# Patient Record
Sex: Female | Born: 1995 | Race: White | Hispanic: No | Marital: Single | Smoking: Current some day smoker
Health system: Southern US, Community
[De-identification: ages and names within clinical notes are randomized; demographics above are authoritative.]

## PROBLEM LIST (undated history)

## (undated) ENCOUNTER — Inpatient Hospital Stay (HOSPITAL_COMMUNITY): Payer: Self-pay

## (undated) DIAGNOSIS — R011 Cardiac murmur, unspecified: Secondary | ICD-10-CM

## (undated) DIAGNOSIS — Z3490 Encounter for supervision of normal pregnancy, unspecified, unspecified trimester: Secondary | ICD-10-CM

## (undated) HISTORY — PX: ADENOIDECTOMY: SUR15

## (undated) HISTORY — DX: Encounter for supervision of normal pregnancy, unspecified, unspecified trimester: Z34.90

## (undated) HISTORY — PX: TONSILLECTOMY: SUR1361

## (undated) HISTORY — PX: OTHER SURGICAL HISTORY: SHX169

---
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
## 2019-02-01 ENCOUNTER — Ambulatory Visit (INDEPENDENT_AMBULATORY_CARE_PROVIDER_SITE_OTHER): Payer: Medicaid Other | Admitting: Family Medicine

## 2019-02-01 ENCOUNTER — Encounter: Payer: Self-pay | Admitting: Family Medicine

## 2019-02-01 ENCOUNTER — Other Ambulatory Visit: Payer: Self-pay

## 2019-02-01 VITALS — BP 114/60 | HR 60 | Temp 98.2°F | Resp 16 | Ht 63.0 in | Wt 122.0 lb

## 2019-02-01 DIAGNOSIS — Z113 Encounter for screening for infections with a predominantly sexual mode of transmission: Secondary | ICD-10-CM

## 2019-02-01 DIAGNOSIS — Z30431 Encounter for routine checking of intrauterine contraceptive device: Secondary | ICD-10-CM

## 2019-02-01 DIAGNOSIS — N926 Irregular menstruation, unspecified: Secondary | ICD-10-CM

## 2019-02-01 DIAGNOSIS — Z3202 Encounter for pregnancy test, result negative: Secondary | ICD-10-CM | POA: Diagnosis not present

## 2019-02-01 LAB — POCT URINE PREGNANCY: Preg Test, Ur: NEGATIVE

## 2019-02-01 NOTE — Progress Notes (Signed)
[facility]  New Patient Encounter Provider: Mike Gipndre Carold Eisner, FNP    [id number]  [id number]:[id number]  DOB - [birth year]  SUBJECTIVE:   Krista BoerKatie Stegmann, is a 23 y.o. female who presents to establish care with this clinic.   Current problems/concerns:  She reports having Mirena placed in 2017. Since then, she has been having irregular periods. Patient states that her periods have been lasting much longer over the past year. She states that she had bled for a full month in the past. Patient reports that she is currently sexually active with one female partner without barrier methods. Denies abnormal vaginal discharge or stomach pain at the present time. Patient states that she has not had the IUD checked since receiving it.  No Known Allergies Past Medical History:  Diagnosis Date  . Heart murmur   . Pregnant 05/01/2015   Current Outpatient Medications on File Prior to Visit  Medication Sig Dispense Refill  . levonorgestrel (MIRENA) 20 MCG/24HR IUD 1 each by Intrauterine route once.     No current facility-administered medications on file prior to visit.    Family History  Problem Relation Age of Onset  . Hyperlipidemia Mother   . Allergies Brother   . Hypertension Maternal Grandfather   . Hyperlipidemia Maternal Grandfather    Social History   Socioeconomic History  . Marital status: Single    Spouse name: Not on file  . Number of children: Not on file  . Years of education: Not on file  . Highest education level: Not on file  Occupational History  . Not on file  Social Needs  . Financial resource strain: Not on file  . Food insecurity    Worry: Not on file    Inability: Not on file  . Transportation needs    Medical: Not on file    Non-medical: Not on file  Tobacco Use  . Smoking status: Former Smoker    Types: Cigarettes  . Smokeless tobacco: Former NeurosurgeonUser    Types: Chew  Substance and Sexual Activity  . Alcohol use: Yes     Comment: occ  . Drug use: No  . Sexual activity: Not Currently    Birth control/protection: None  Lifestyle  . Physical activity    Days per week: Not on file    Minutes per session: Not on file  . Stress: Not on file  Relationships  . Social Musicianconnections    Talks on phone: Not on file    Gets together: Not on file    Attends religious service: Not on file    Active member of club or organization: Not on file    Attends meetings of clubs or organizations: Not on file    Relationship status: Not on file  . Intimate partner violence    Fear of current or ex partner: Not on file    Emotionally abused: Not on file    Physically abused: Not on file    Forced sexual activity: Not on file  Other Topics Concern  . Not on file  Social History Narrative  . Not on file    Review of Systems  Constitutional: Negative.   HENT: Negative.   Eyes: Negative.   Respiratory: Negative.   Cardiovascular: Negative.   Gastrointestinal: Negative.   Genitourinary: Negative.   Musculoskeletal: Negative.   Skin: Negative.   Neurological: Negative.   Psychiatric/Behavioral: Negative.     OBJECTIVE:    BP 114/60 (BP Location: Left Arm,  Patient Position: Sitting, Cuff Size: Normal)   Pulse 60   Temp 98.2 F (36.8 C) (Oral)   Resp 16   Ht 5\' 3"  (1.6 m)   Wt 122 lb (55.3 kg)   LMP 01/01/2019   SpO2 100%   BMI 21.61 kg/m   Physical Exam  Constitutional: She is oriented to person, place, and time and well-developed, well-nourished, and in no distress. No distress.  HENT:  Head: Normocephalic and atraumatic.  Eyes: Pupils are equal, round, and reactive to light. Conjunctivae and EOM are normal.  Neck: Normal range of motion. Neck supple.  Cardiovascular: Normal rate, regular rhythm and intact distal pulses. Exam reveals no gallop and no friction rub.  No murmur heard. Pulmonary/Chest: Effort normal and breath sounds normal. No respiratory distress. She has no wheezes.  Abdominal: Soft.  Bowel sounds are normal. There is no abdominal tenderness.  Musculoskeletal: Normal range of motion.        General: No tenderness or edema.  Lymphadenopathy:    She has no cervical adenopathy.  Neurological: She is alert and oriented to person, place, and time. Gait normal.  Skin: Skin is warm and dry.  Psychiatric: Mood, memory, affect and judgment normal.  Nursing note and vitals reviewed.    ASSESSMENT/PLAN:  1. Screening for STD (sexually transmitted disease) - Chlamydia/GC NAA, Confirmation - HIV antibody (with reflex) - HepB+HepC+HIV Panel - RPR  2. Irregular periods - POCT urine pregnancy - CBC with Differential - Comprehensive metabolic panel - TSH - Ambulatory referral to Gynecology  3. Surveillance for birth control, intrauterine device - Ambulatory referral to Gynecology    Return in about 1 year (around 02/01/2020) for Physical.  The patient was given clear instructions to go to ER or return to medical center if symptoms don't improve, worsen or new problems develop. The patient verbalized understanding. The patient was told to call to get lab results if they haven't heard anything in the next week.     This note has been created with Surveyor, quantity. Any transcriptional errors are unintentional.   Ms. Andr L. Nathaneil Canary, FNP-BC [facility] [address] [place], [place] [address] [phone]

## 2019-02-01 NOTE — Patient Instructions (Signed)
Health Maintenance, Female Adopting a healthy lifestyle and getting preventive care are important in promoting health and wellness. Ask your health care provider about:  The right schedule for you to have regular tests and exams.  Things you can do on your own to prevent diseases and keep yourself healthy. What should I know about diet, weight, and exercise? Eat a healthy diet   Eat a diet that includes plenty of vegetables, fruits, low-fat dairy products, and lean protein.  Do not eat a lot of foods that are high in solid fats, added sugars, or sodium. Maintain a healthy weight Body mass index (BMI) is used to identify weight problems. It estimates body fat based on height and weight. Your health care provider can help determine your BMI and help you achieve or maintain a healthy weight. Get regular exercise Get regular exercise. This is one of the most important things you can do for your health. Most adults should:  Exercise for at least 150 minutes each week. The exercise should increase your heart rate and make you sweat (moderate-intensity exercise).  Do strengthening exercises at least twice a week. This is in addition to the moderate-intensity exercise.  Spend less time sitting. Even light physical activity can be beneficial. Watch cholesterol and blood lipids Have your blood tested for lipids and cholesterol at 20 years of age, then have this test every 5 years. Have your cholesterol levels checked more often if:  Your lipid or cholesterol levels are high.  You are older than 21 years of age.  You are at high risk for heart disease. What should I know about cancer screening? Depending on your health history and family history, you may need to have cancer screening at various ages. This may include screening for:  Breast cancer.  Cervical cancer.  Colorectal cancer.  Skin cancer.  Lung cancer. What should I know about heart disease, diabetes, and high blood  pressure? Blood pressure and heart disease  High blood pressure causes heart disease and increases the risk of stroke. This is more likely to develop in people who have high blood pressure readings, are of African descent, or are overweight.  Have your blood pressure checked: ? Every 3-5 years if you are 18-39 years of age. ? Every year if you are 40 years old or older. Diabetes Have regular diabetes screenings. This checks your fasting blood sugar level. Have the screening done:  Once every three years after age 40 if you are at a normal weight and have a low risk for diabetes.  More often and at a younger age if you are overweight or have a high risk for diabetes. What should I know about preventing infection? Hepatitis B If you have a higher risk for hepatitis B, you should be screened for this virus. Talk with your health care provider to find out if you are at risk for hepatitis B infection. Hepatitis C Testing is recommended for:  Everyone born from 1945 through 1965.  Anyone with known risk factors for hepatitis C. Sexually transmitted infections (STIs)  Get screened for STIs, including gonorrhea and chlamydia, if: ? You are sexually active and are younger than 21 years of age. ? You are older than 21 years of age and your health care provider tells you that you are at risk for this type of infection. ? Your sexual activity has changed since you were last screened, and you are at increased risk for chlamydia or gonorrhea. Ask your health care provider if   you are at risk.  Ask your health care provider about whether you are at high risk for HIV. Your health care provider may recommend a prescription medicine to help prevent HIV infection. If you choose to take medicine to prevent HIV, you should first get tested for HIV. You should then be tested every 3 months for as long as you are taking the medicine. Pregnancy  If you are about to stop having your period (premenopausal) and  you may become pregnant, seek counseling before you get pregnant.  Take 400 to 800 micrograms (mcg) of folic acid every day if you become pregnant.  Ask for birth control (contraception) if you want to prevent pregnancy. Osteoporosis and menopause Osteoporosis is a disease in which the bones lose minerals and strength with aging. This can result in bone fractures. If you are 65 years old or older, or if you are at risk for osteoporosis and fractures, ask your health care provider if you should:  Be screened for bone loss.  Take a calcium or vitamin D supplement to lower your risk of fractures.  Be given hormone replacement therapy (HRT) to treat symptoms of menopause. Follow these instructions at home: Lifestyle  Do not use any products that contain nicotine or tobacco, such as cigarettes, e-cigarettes, and chewing tobacco. If you need help quitting, ask your health care provider.  Do not use street drugs.  Do not share needles.  Ask your health care provider for help if you need support or information about quitting drugs. Alcohol use  Do not drink alcohol if: ? Your health care provider tells you not to drink. ? You are pregnant, may be pregnant, or are planning to become pregnant.  If you drink alcohol: ? Limit how much you use to 0-1 drink a day. ? Limit intake if you are breastfeeding.  Be aware of how much alcohol is in your drink. In the [place] one drink equals one 12 oz bottle of beer (355 mL), one 5 oz glass of wine (148 mL), or one 1 oz glass of hard liquor (44 mL). General instructions  Schedule regular health, dental, and eye exams.  Stay current with your vaccines.  Tell your health care provider if: ? You often feel depressed. ? You have ever been abused or do not feel safe at home. Summary  Adopting a healthy lifestyle and getting preventive care are important in promoting health and wellness.  Follow your health care provider's instructions about healthy  diet, exercising, and getting tested or screened for diseases.  Follow your health care provider's instructions on monitoring your cholesterol and blood pressure. This information is not intended to replace advice given to you by your health care provider. Make sure you discuss any questions you have with your health care provider. Document Released: 11/30/2010 Document Revised: 05/10/2018 Document Reviewed: 05/10/2018 Elsevier Patient Education  2020 [employer]  

## 2019-02-02 ENCOUNTER — Telehealth: Payer: Self-pay

## 2019-02-02 LAB — HEPB+HEPC+HIV PANEL
HIV Screen 4th Generation wRfx: NONREACTIVE
Hep B C IgM: NEGATIVE
Hep B Core Total Ab: NEGATIVE
Hep B E Ab: NEGATIVE
Hep B E Ag: NEGATIVE
Hep B Surface Ab, Qual: NONREACTIVE
Hep C Virus Ab: <0.1 s/co ratio (ref 0.0–0.9)
Hepatitis B Surface Ag: NEGATIVE

## 2019-02-02 LAB — TSH: TSH: 3.33 u[IU]/mL (ref 0.450–4.500)

## 2019-02-02 LAB — CBC WITH DIFFERENTIAL/PLATELET
Basophils Absolute: 0.1 10*3/uL (ref 0.0–0.2)
Basos: 1 %
EOS (ABSOLUTE): 0.1 10*3/uL (ref 0.0–0.4)
Eos: 1 %
Hematocrit: 38.9 % (ref 34.0–46.6)
Hemoglobin: 13.1 g/dL (ref 11.1–15.9)
Immature Grans (Abs): 0 10*3/uL (ref 0.0–0.1)
Immature Granulocytes: 0 %
Lymphocytes Absolute: 2.3 10*3/uL (ref 0.7–3.1)
Lymphs: 32 %
MCH: 32.2 pg (ref 26.6–33.0)
MCHC: 33.7 g/dL (ref 31.5–35.7)
MCV: 96 fL (ref 79–97)
Monocytes Absolute: 0.6 10*3/uL (ref 0.1–0.9)
Monocytes: 9 %
Neutrophils Absolute: 4 10*3/uL (ref 1.4–7.0)
Neutrophils: 57 %
Platelets: 282 10*3/uL (ref 150–450)
RBC: 4.07 x10E6/uL (ref 3.77–5.28)
RDW: 12.2 % (ref 11.7–15.4)
WBC: 7 10*3/uL (ref 3.4–10.8)

## 2019-02-02 LAB — COMPREHENSIVE METABOLIC PANEL
ALT: 8 IU/L (ref 0–32)
AST: 13 IU/L (ref 0–40)
Albumin/Globulin Ratio: 2.2 (ref 1.2–2.2)
Albumin: 4.6 g/dL (ref 3.9–5.0)
Alkaline Phosphatase: 58 IU/L (ref 39–117)
BUN/Creatinine Ratio: 12 (ref 9–23)
BUN: 9 mg/dL (ref 6–20)
Bilirubin Total: 0.4 mg/dL (ref 0.0–1.2)
CO2: 21 mmol/L (ref 20–29)
Calcium: 9.3 mg/dL (ref 8.7–10.2)
Chloride: 106 mmol/L (ref 96–106)
Creatinine, Ser: 0.73 mg/dL (ref 0.57–1.00)
GFR calc Af Amer: 136 mL/min/{1.73_m2} (ref >59)
GFR calc non Af Amer: 118 mL/min/{1.73_m2} (ref >59)
Globulin, Total: 2.1 g/dL (ref 1.5–4.5)
Glucose: 83 mg/dL (ref 65–99)
Potassium: 3.9 mmol/L (ref 3.5–5.2)
Sodium: 142 mmol/L (ref 134–144)
Total Protein: 6.7 g/dL (ref 6.0–8.5)

## 2019-02-02 LAB — RPR: RPR Ser Ql: NONREACTIVE

## 2019-02-02 NOTE — Telephone Encounter (Signed)
Called, no answer. No voicemail picked up. Will try later.  

## 2019-02-02 NOTE — Telephone Encounter (Signed)
-----   Message from Lanae Boast, Cecilia sent at 02/02/2019  8:25 AM EDT ----- Your labs were normal.  HIV and hep C are still pending.

## 2019-02-02 NOTE — Telephone Encounter (Signed)
Called and spoke with patient, advised that all labs are stable and there are still two pending and we will contact her when these result. Thanks!

## 2019-02-06 LAB — CHLAMYDIA/GC NAA, CONFIRMATION
Chlamydia trachomatis, NAA: NEGATIVE
Neisseria gonorrhoeae, NAA: NEGATIVE

## 2019-02-07 ENCOUNTER — Encounter (HOSPITAL_COMMUNITY): Payer: Self-pay | Admitting: *Deleted

## 2019-02-07 ENCOUNTER — Encounter (HOSPITAL_COMMUNITY): Payer: Self-pay

## 2019-04-03 ENCOUNTER — Other Ambulatory Visit (HOSPITAL_COMMUNITY)
Admission: RE | Admit: 2019-04-03 | Discharge: 2019-04-03 | Disposition: A | Discharge status: Home / Self Care | Payer: Medicaid Other | Source: Ambulatory Visit

## 2019-04-03 ENCOUNTER — Other Ambulatory Visit: Payer: Self-pay

## 2019-04-03 ENCOUNTER — Ambulatory Visit (INDEPENDENT_AMBULATORY_CARE_PROVIDER_SITE_OTHER): Payer: Medicaid Other

## 2019-04-03 VITALS — BP 112/72 | HR 75 | Ht 63.0 in | Wt 125.0 lb

## 2019-04-03 DIAGNOSIS — Z124 Encounter for screening for malignant neoplasm of cervix: Secondary | ICD-10-CM

## 2019-04-03 DIAGNOSIS — Z0000 Encounter for general adult medical examination without abnormal findings: Secondary | ICD-10-CM

## 2019-04-03 DIAGNOSIS — Z01419 Encounter for gynecological examination (general) (routine) without abnormal findings: Secondary | ICD-10-CM

## 2019-04-03 DIAGNOSIS — Z975 Presence of (intrauterine) contraceptive device: Secondary | ICD-10-CM

## 2019-04-03 NOTE — Progress Notes (Signed)
GYNECOLOGY OFFICE VISIT NOTE  History:   Krista Nguyen [id number] here today for annual well woman exam.  She is currently utilizing Mirena IUD for birth control and reports dissatisfaction with this method.  She states she had it placed in July 2017 and has experienced irregular periods that "are all over the place."  However, patient states she started having daily light spotting in September 2019 that lasted until July 2020.  She denies cramping, discharge, or odor during that time.  She goes on to report that she has had no bleeding since July and this is now of concern.  Patient reports that she "wants a break, but not too long, like a few weeks" from birth control.  She endorses a desire to have more children, but in the future.  She reports using pills in the past, but declines reinitiating of this method.   In regards to other health, [place] denies any abnormal vaginal discharge, pelvic pain or pain/discomfort with sex. She declines STD testing and reports that she has never had a pap smear.  She does not perform SBE, but shows breast awareness and reports that her great grandmother did have breast cancer.  She has a PCP and has been seen this year. She reports exercise in the form of walking 2-3x/week and endorses a balanced nutritional intake. She endorses occasional tobacco and ETOH usage, but denies illicit drug usage.  She endorses safety at home and reports having a good social support system.    Past Medical History:  Diagnosis Date  . Heart murmur   . Pregnant 05/01/2015    Past Surgical History:  Procedure Laterality Date  . ADENOIDECTOMY    . FRACTURE SURGERY  2011  . TONSILLECTOMY    . tubes in ears    . WISDOM TOOTH EXTRACTION  03/07/15    The following portions of the patient's history were reviewed and updated as appropriate: allergies, current medications, past family history, past medical history, past social history, past surgical history and problem list.    Health Maintenance:  No pap history d/t age; Collected today.  No mammogram history d/t age.  Review of Systems:  Pertinent items noted in HPI and remainder of comprehensive ROS otherwise negative.    Objective:    Physical Exam BP 112/72   Pulse 75   Ht 5\' 3"  (1.6 m)   Wt 125 lb (56.7 kg)   LMP 11/29/2018 Comment: Mirena IUD   BMI 22.14 kg/m  Physical Exam Exam conducted with a chaperone present.  Constitutional:      Appearance: Normal appearance.  HENT:     Head: Normocephalic and atraumatic.  Eyes:     Conjunctiva/sclera: Conjunctivae normal.  Neck:     Musculoskeletal: Normal range of motion.     Thyroid: No thyroid tenderness.  Cardiovascular:     Rate and Rhythm: Normal rate and regular rhythm.     Heart sounds: Normal heart sounds.  Pulmonary:     Effort: Pulmonary effort is normal.     Breath sounds: Normal breath sounds.  Chest:     Breasts:        Right: No inverted nipple, mass, nipple discharge, skin change or tenderness.        Left: No inverted nipple, mass, nipple discharge, skin change or tenderness.  Abdominal:     General: Abdomen is flat. Bowel sounds are normal.     Palpations: Abdomen is soft.     Tenderness: There is no  abdominal tenderness.  Genitourinary:    Vagina: No vaginal discharge.     Cervix: No discharge, friability or erythema.     Uterus: Not enlarged.      Adnexa:        Right: No tenderness.         Left: No tenderness.       Comments: IUD Strings visualized CV and Pap Smear Collected. Musculoskeletal: Normal range of motion.  Skin:    General: Skin is warm and dry.  Neurological:     Mental Status: She is alert and oriented to person, place, and time.      Labs and Imaging No results found for this or any previous visit (from the past 168 hour(s)). No results found.   Assessment & Plan:       1. Well woman exam with routine gynecological exam -Introduced to instruments and tools used during pelvic exam. -Exam  performed and findings discussed. -Educated on ASCCP guidelines regarding pap smear evaluation and frequency. -Informed of turnover time and provider/clinic policy on releasing results. -Encouraged to utilize Mychart for communication and reviewing of results. -Educated on AHA exercise recommendations of 30 minutes of moderate to vigorous activity at least 5x/week. -Educated and encouraged to initiate monthly SBE with increased breast awareness including examination of breast for skin changes, moles, tenderness, etc.   2. Screening for cervical cancer -Pap smear collected and sent. - Cytology - PAP( [facility])  3. IUD (intrauterine device) in place -Informed that IUD still in place. -Extensive discussion of how hormonal IUDs are known to cause change in bleeding pattern and frequency.  Reassured of normalcy of amenorrhea on hormonal methods.  -Reviewed and discussed birth control methods by tiered approach including effectiveness, risks, and benefits. -Encouraged to consider wants and expectations regarding future birth control method. -Instructed to return in 1-3 weeks for removal of IUD if still desired.   Routine preventative health maintenance measures emphasized. Please refer to After Visit Summary for other counseling recommendations.   Return in about 2 weeks (around 04/17/2019), or IUD Removal.      Maryann Conners, CNM 04/03/2019

## 2019-04-03 NOTE — Progress Notes (Signed)
NGYN patient presents for problem visit today to discuss irregular periods   LMP:11/29/2018-12/20/2018 Last pap: N/A due to age. Contraception: Mirena 11/2015 pt states she had periods before.  STD Screening: Declines    CC: Has not had period since 11/2018 pt states concern.pt would rater have a monthly cycle.  Pt considering having IUD removed. wants to discuss other birth control.  Pt does not want pills.

## 2019-04-03 NOTE — Patient Instructions (Signed)

## 2019-04-05 LAB — CYTOLOGY - PAP: Diagnosis: NEGATIVE

## 2019-04-06 ENCOUNTER — Other Ambulatory Visit (HOSPITAL_COMMUNITY): Payer: Self-pay

## 2019-04-06 DIAGNOSIS — A5901 Trichomonal vulvovaginitis: Secondary | ICD-10-CM

## 2019-04-06 MED ORDER — METRONIDAZOLE 500 MG PO TABS: 2000.0000 mg | ORAL_TABLET | Freq: Once | ORAL | 0 refills | Status: DC

## 2019-04-18 ENCOUNTER — Ambulatory Visit: Payer: Medicaid Other | Admitting: Family Medicine

## 2019-05-29 ENCOUNTER — Other Ambulatory Visit: Payer: Self-pay

## 2019-05-29 MED ORDER — METRONIDAZOLE 500 MG PO TABS: 2000.0000 mg | ORAL_TABLET | Freq: Once | ORAL | 0 refills | Status: AC

## 2020-01-22 DIAGNOSIS — R931 Abnormal findings on diagnostic imaging of heart and coronary circulation: Secondary | ICD-10-CM | POA: Diagnosis not present

## 2020-01-22 DIAGNOSIS — I361 Nonrheumatic tricuspid (valve) insufficiency: Secondary | ICD-10-CM | POA: Diagnosis not present

## 2020-01-22 DIAGNOSIS — Q249 Congenital malformation of heart, unspecified: Secondary | ICD-10-CM | POA: Diagnosis not present

## 2020-11-21 DIAGNOSIS — Z309 Encounter for contraceptive management, unspecified: Secondary | ICD-10-CM | POA: Diagnosis not present

## 2020-11-21 DIAGNOSIS — Z975 Presence of (intrauterine) contraceptive device: Secondary | ICD-10-CM | POA: Diagnosis not present

## 2021-12-23 DIAGNOSIS — Z113 Encounter for screening for infections with a predominantly sexual mode of transmission: Secondary | ICD-10-CM | POA: Diagnosis not present
